# Patient Record
Sex: Female | Born: 1995 | Race: White | Hispanic: No | Marital: Single | Smoking: Former smoker
Health system: Southern US, Community
[De-identification: ages and names within clinical notes are randomized; demographics above are authoritative.]

## PROBLEM LIST (undated history)

## (undated) VITALS — BP 95/63 | HR 101 | Temp 97.7°F | Resp 16 | Ht 64.0 in | Wt 112.7 lb

## (undated) DIAGNOSIS — F32A Depression, unspecified: Secondary | ICD-10-CM

## (undated) DIAGNOSIS — F329 Major depressive disorder, single episode, unspecified: Secondary | ICD-10-CM

## (undated) DIAGNOSIS — F1910 Other psychoactive substance abuse, uncomplicated: Secondary | ICD-10-CM

## (undated) DIAGNOSIS — F99 Mental disorder, not otherwise specified: Secondary | ICD-10-CM

## (undated) HISTORY — PX: TONSILLECTOMY: SUR1361

## (undated) HISTORY — PX: EXTERNAL EAR SURGERY: SHX627

---
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
## 2017-05-17 DEATH — deceased

## 2017-07-08 IMAGING — MR MR HEAD WO/W CM
9 of 12 series · 35 of 48 positions shown · IV contrast (multihance)
Comparison: None.

CLINICAL DATA: 19-year-old female with nausea and vomiting for 4
days. Could Recent heroin overdose. Reports a self administered
Tylenol overdose recently.

EXAM:
MRI HEAD WITHOUT AND WITH CONTRAST
TECHNIQUE: Multiplanar, multiecho pulse sequences of the brain and surrounding
structures were obtained without and with intravenous contrast.
CONTRAST:  9mL MULTIHANCE GADOBENATE DIMEGLUMINE 529 MG/ML IV SOLN

[Series 3: T1 · sagittal · 5.0mm · 0.47mm/px · 2 of 25 slices shown]
[im 1/25]
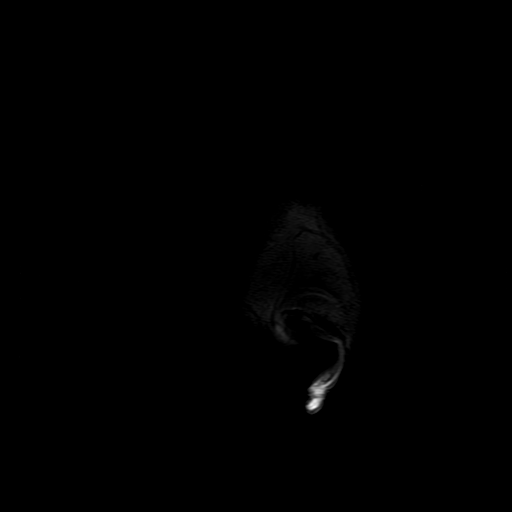
[im 13/25]
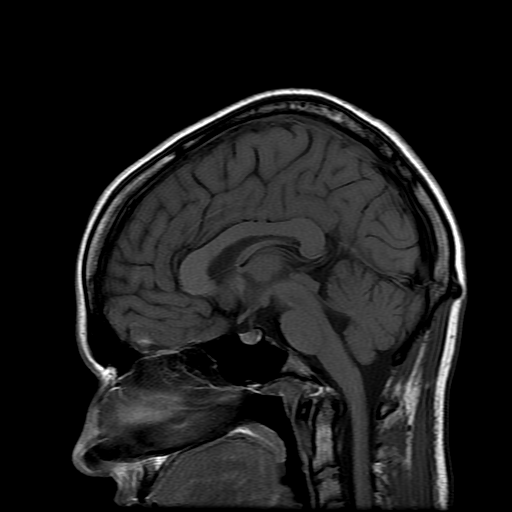

[Series 4: DWI · axial · 3.0mm · 1.09mm/px · z∈[-62,+85]mm · 8 of 110 slices shown (1 of 4)]
[im 1/110]
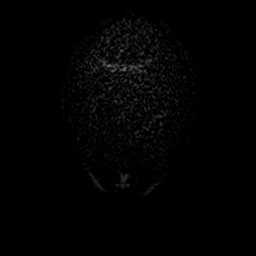
[im 13/110]
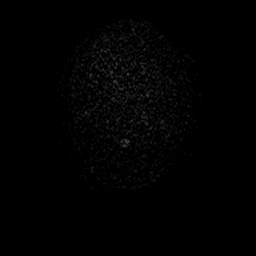
[im 37/110]
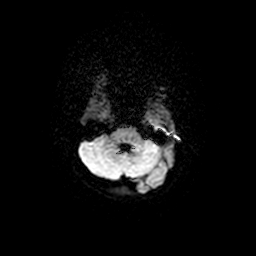
[im 49/110]
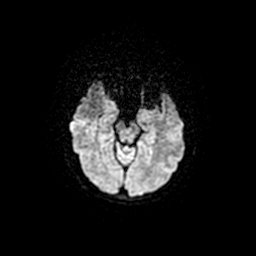
[im 61/110]
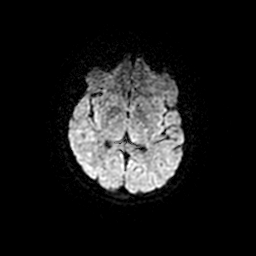
[im 73/110]
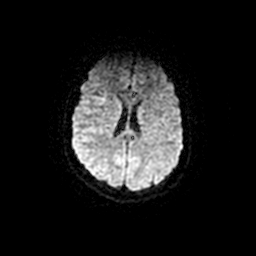
[im 97/110]
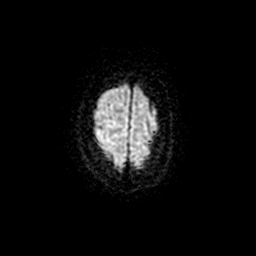
[im 110/110]
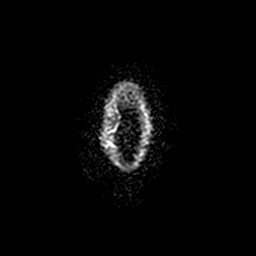

[Series 5: DWI · coronal · 5.0mm · 1.09mm/px · 7 of 72 slices shown (2 of 4)]
[im 1/72]
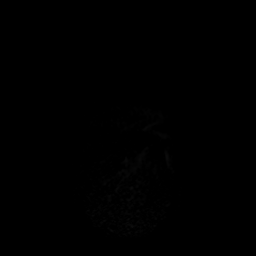
[im 12/72]
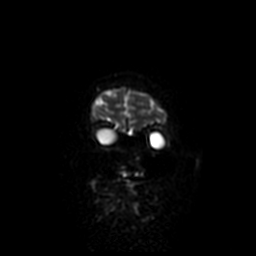
[im 24/72]
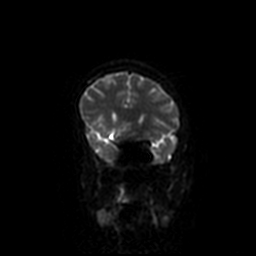
[im 36/72]
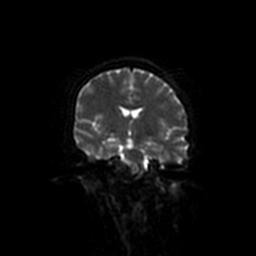
[im 48/72]
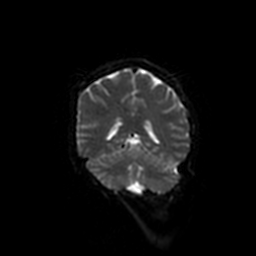
[im 60/72]
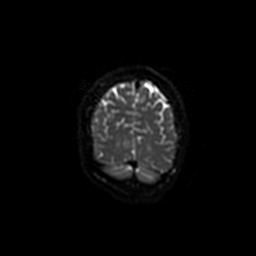
[im 72/72]
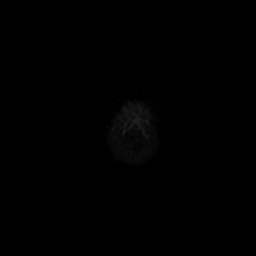

[Series 6: T2 · axial · 5.0mm · 0.43mm/px · z∈[-40,+97]mm · 2 of 24 slices shown]
[im 1/24]
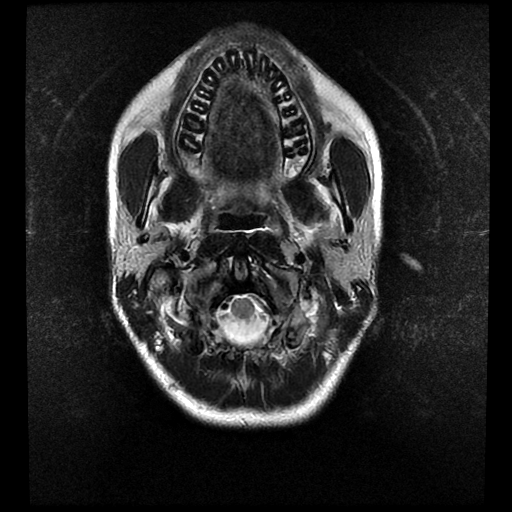
[im 24/24]
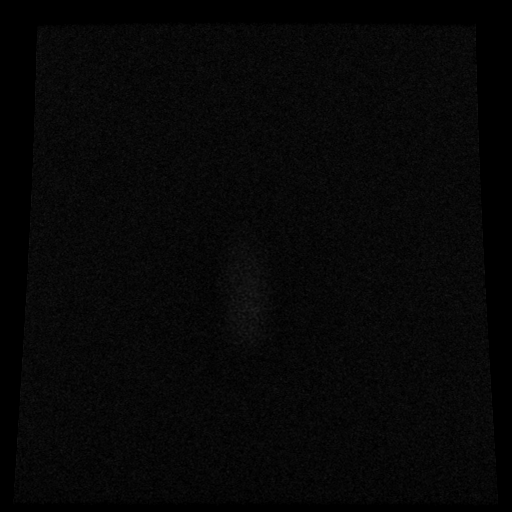

[Series 7: FLAIR · axial · 5.0mm · 0.43mm/px · z∈[-45,+102]mm · 2 of 24 slices shown]
[im 1/24]
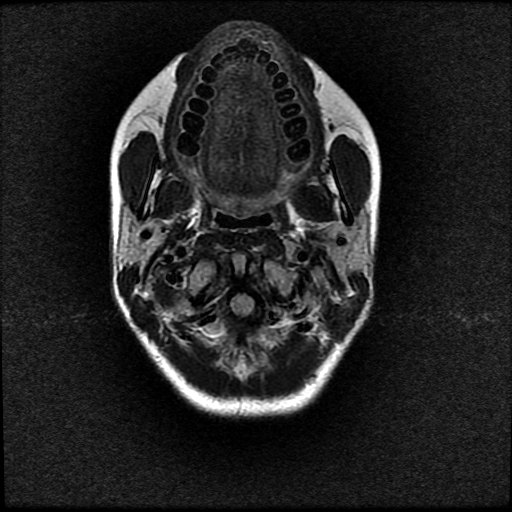
[im 24/24]
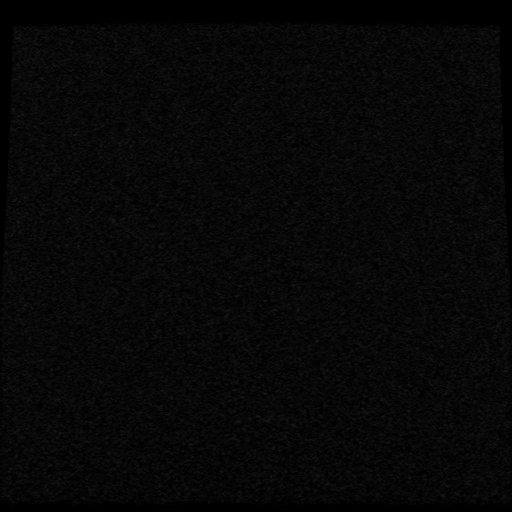

[Series 12: T2 post-contrast · coronal · 5.0mm · 0.45mm/px · 3 of 28 slices shown]
[im 1/28]
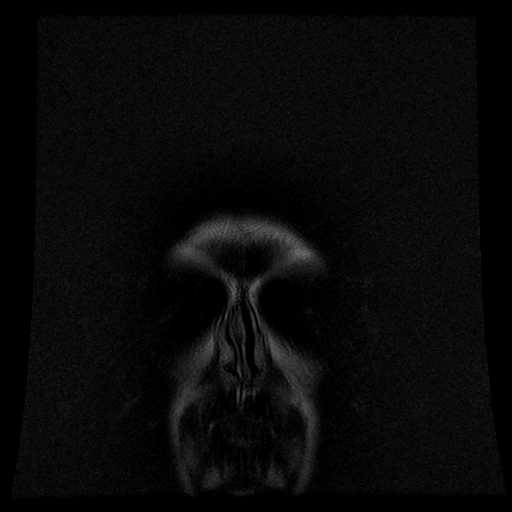
[im 14/28]
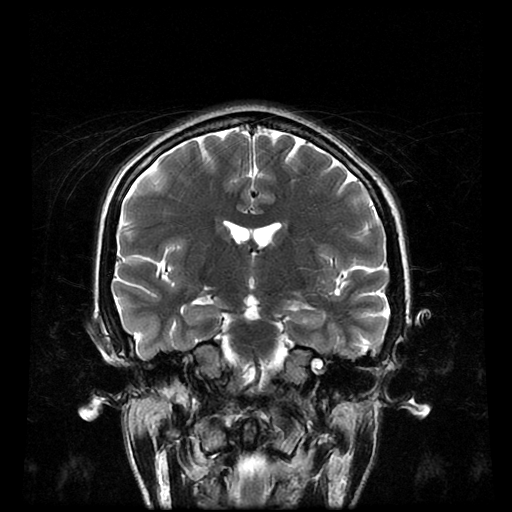
[im 28/28]
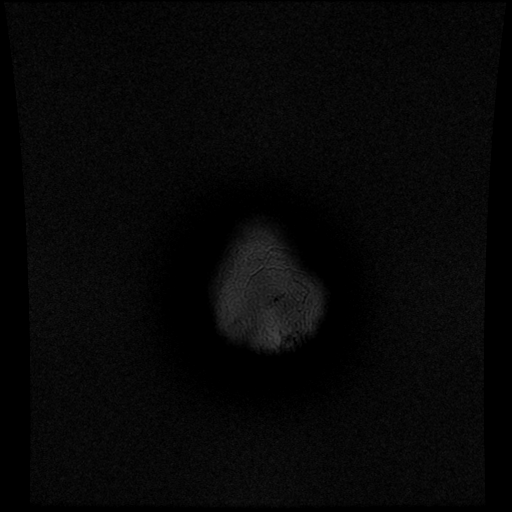

[Series 14: T1 post-contrast · coronal · 5.0mm · 0.45mm/px · 3 of 28 slices shown]
[im 1/28]
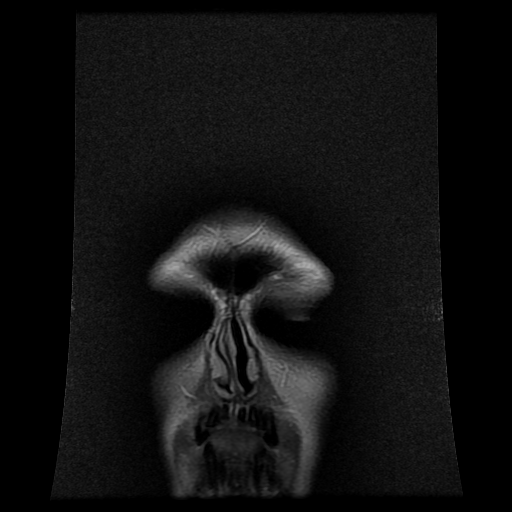
[im 14/28]
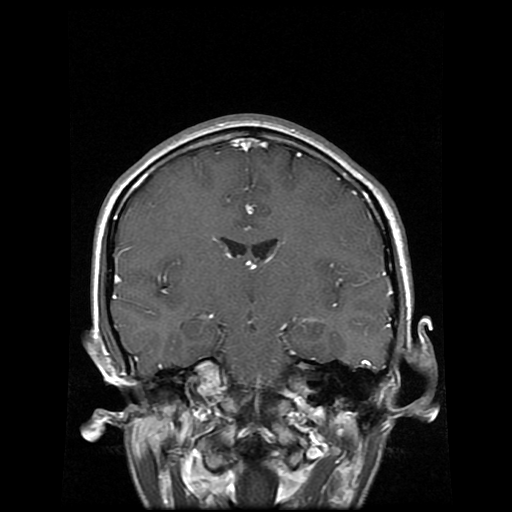
[im 28/28]
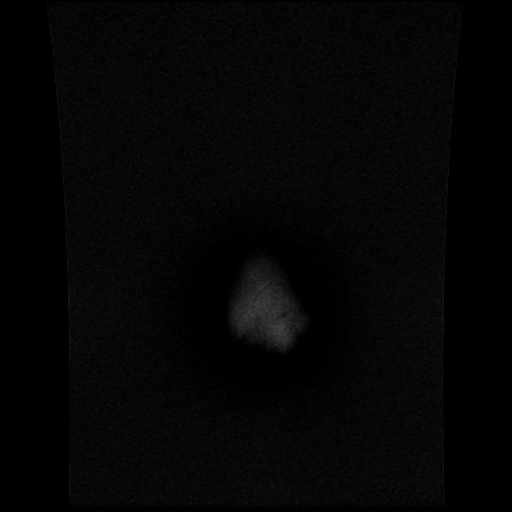

[Series 400: DWI · axial · 3.0mm · 1.09mm/px · z∈[-62,+85]mm · 5 of 55 slices shown (3 of 4)]
[im 1/55]
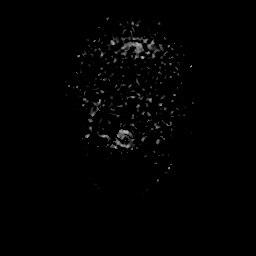
[im 14/55]
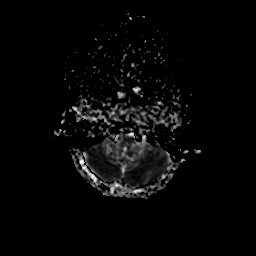
[im 28/55]
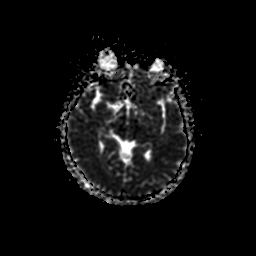
[im 41/55]
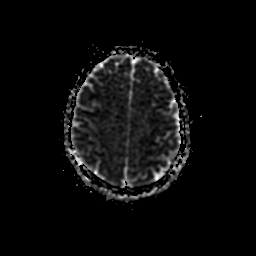
[im 55/55]
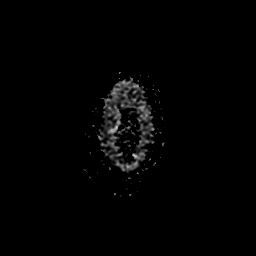

[Series 500: DWI · coronal · 5.0mm · 1.09mm/px · 3 of 36 slices shown (4 of 4)]
[im 1/36]
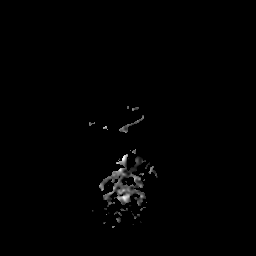
[im 18/36]
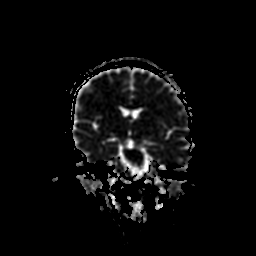
[im 36/36]
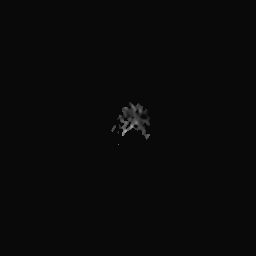

[35 of 48 positions shown; findings below may reference images not displayed]

FINDINGS: Cerebral volume is within normal limits. No restricted diffusion to
suggest acute infarction. No midline shift, mass effect, evidence of
mass lesion, ventriculomegaly, extra-axial collection or acute
intracranial hemorrhage. Cervicomedullary junction and pituitary are
within normal limits. Negative visualized cervical spine. Major
intracranial vascular flow voids are within normal limits. No
definite abnormal gray or white matter signal. No cortical
encephalomalacia or chronic cerebral blood products. No abnormal
enhancement identified.

Paranasal sinuses and mastoids are clear. Negative orbit and scalp
soft tissues.
IMPRESSION: Normal MRI appearance of the brain.
